# Patient Record
Sex: Male | Born: 1945 | Race: White | Hispanic: No | Marital: Single
Health system: Southern US, Community
[De-identification: ages and names within clinical notes are randomized; demographics above are authoritative.]

---

## 2003-08-19 ENCOUNTER — Other Ambulatory Visit: Payer: Self-pay

## 2004-02-19 ENCOUNTER — Other Ambulatory Visit: Payer: Self-pay

## 2004-02-26 ENCOUNTER — Inpatient Hospital Stay: Payer: Self-pay | Admitting: General Surgery

## 2004-04-14 ENCOUNTER — Ambulatory Visit: Payer: Self-pay | Admitting: Internal Medicine

## 2004-04-23 ENCOUNTER — Ambulatory Visit: Payer: Self-pay | Admitting: Internal Medicine

## 2004-10-13 ENCOUNTER — Ambulatory Visit: Payer: Self-pay | Admitting: Internal Medicine

## 2004-10-22 ENCOUNTER — Ambulatory Visit: Payer: Self-pay | Admitting: Internal Medicine

## 2005-01-28 ENCOUNTER — Emergency Department: Payer: Self-pay | Admitting: General Practice

## 2005-01-28 ENCOUNTER — Other Ambulatory Visit: Payer: Self-pay

## 2005-02-09 ENCOUNTER — Emergency Department: Payer: Self-pay | Admitting: Emergency Medicine

## 2005-02-09 ENCOUNTER — Other Ambulatory Visit: Payer: Self-pay

## 2005-03-27 ENCOUNTER — Emergency Department: Payer: Self-pay | Admitting: Emergency Medicine

## 2005-04-13 ENCOUNTER — Ambulatory Visit: Payer: Self-pay | Admitting: Internal Medicine

## 2005-04-23 ENCOUNTER — Ambulatory Visit: Payer: Self-pay | Admitting: Internal Medicine

## 2013-07-04 ENCOUNTER — Emergency Department: Payer: Self-pay

## 2013-07-04 LAB — CBC
HCT: 33.9 % — AB (ref 40.0–52.0)
HGB: 11.4 g/dL — ABNORMAL LOW (ref 13.0–18.0)
MCH: 30.1 pg (ref 26.0–34.0)
MCHC: 33.8 g/dL (ref 32.0–36.0)
MCV: 89 fL (ref 80–100)
Platelet: 96 10*3/uL — ABNORMAL LOW (ref 150–440)
RBC: 3.81 10*6/uL — AB (ref 4.40–5.90)
RDW: 14 % (ref 11.5–14.5)
WBC: 9.2 10*3/uL (ref 3.8–10.6)

## 2013-07-04 LAB — COMPREHENSIVE METABOLIC PANEL
ALK PHOS: 58 U/L
ALT: 13 U/L (ref 12–78)
ANION GAP: 7 (ref 7–16)
Albumin: 3 g/dL — ABNORMAL LOW (ref 3.4–5.0)
BILIRUBIN TOTAL: 0.3 mg/dL (ref 0.2–1.0)
BUN: 39 mg/dL — ABNORMAL HIGH (ref 7–18)
CREATININE: 1.3 mg/dL (ref 0.60–1.30)
Calcium, Total: 9.6 mg/dL (ref 8.5–10.1)
Chloride: 105 mmol/L (ref 98–107)
Co2: 25 mmol/L (ref 21–32)
EGFR (African American): >60
GFR CALC NON AF AMER: 56 — AB
Glucose: 120 mg/dL — ABNORMAL HIGH (ref 65–99)
OSMOLALITY: 284 (ref 275–301)
Potassium: 4.5 mmol/L (ref 3.5–5.1)
SGOT(AST): 9 U/L — ABNORMAL LOW (ref 15–37)
SODIUM: 137 mmol/L (ref 136–145)
TOTAL PROTEIN: 6.4 g/dL (ref 6.4–8.2)

## 2013-07-16 ENCOUNTER — Inpatient Hospital Stay: Payer: Self-pay | Admitting: Internal Medicine

## 2013-07-16 LAB — CBC
HCT: 30 % — ABNORMAL LOW (ref 40.0–52.0)
HGB: 9.8 g/dL — AB (ref 13.0–18.0)
MCH: 29.3 pg (ref 26.0–34.0)
MCHC: 32.7 g/dL (ref 32.0–36.0)
MCV: 90 fL (ref 80–100)
PLATELETS: 137 10*3/uL — AB (ref 150–440)
RBC: 3.36 10*6/uL — ABNORMAL LOW (ref 4.40–5.90)
RDW: 15.1 % — ABNORMAL HIGH (ref 11.5–14.5)
WBC: 16.8 10*3/uL — ABNORMAL HIGH (ref 3.8–10.6)

## 2013-07-16 LAB — CK TOTAL AND CKMB (NOT AT ARMC)
CK, Total: 28 U/L — ABNORMAL LOW
CK-MB: 0.6 ng/mL (ref 0.5–3.6)

## 2013-07-16 LAB — COMPREHENSIVE METABOLIC PANEL
ALK PHOS: 77 U/L
Albumin: 2.9 g/dL — ABNORMAL LOW (ref 3.4–5.0)
Anion Gap: 8 (ref 7–16)
BILIRUBIN TOTAL: 0.7 mg/dL (ref 0.2–1.0)
BUN: 64 mg/dL — ABNORMAL HIGH (ref 7–18)
CALCIUM: 9.2 mg/dL (ref 8.5–10.1)
Chloride: 104 mmol/L (ref 98–107)
Co2: 26 mmol/L (ref 21–32)
Creatinine: 2.52 mg/dL — ABNORMAL HIGH (ref 0.60–1.30)
EGFR (African American): 29 — ABNORMAL LOW
EGFR (Non-African Amer.): 25 — ABNORMAL LOW
GLUCOSE: 140 mg/dL — AB (ref 65–99)
OSMOLALITY: 296 (ref 275–301)
Potassium: 4.4 mmol/L (ref 3.5–5.1)
SGOT(AST): 33 U/L (ref 15–37)
SGPT (ALT): 30 U/L (ref 12–78)
Sodium: 138 mmol/L (ref 136–145)
Total Protein: 6.3 g/dL — ABNORMAL LOW (ref 6.4–8.2)

## 2013-07-16 LAB — TROPONIN I

## 2013-07-16 LAB — CK-MB: CK-MB: 1 ng/mL (ref 0.5–3.6)

## 2013-07-17 ENCOUNTER — Ambulatory Visit: Payer: Self-pay | Admitting: Internal Medicine

## 2013-07-17 LAB — CBC WITH DIFFERENTIAL/PLATELET
Basophil #: 0 10*3/uL (ref 0.0–0.1)
Basophil %: 0.1 %
Eosinophil #: 0 10*3/uL (ref 0.0–0.7)
Eosinophil %: 0 %
HCT: 25.3 % — ABNORMAL LOW (ref 40.0–52.0)
HGB: 8.6 g/dL — AB (ref 13.0–18.0)
LYMPHS PCT: 8.5 %
Lymphocyte #: 1.2 10*3/uL (ref 1.0–3.6)
MCH: 30.5 pg (ref 26.0–34.0)
MCHC: 34 g/dL (ref 32.0–36.0)
MCV: 90 fL (ref 80–100)
Monocyte #: 1 x10 3/mm (ref 0.2–1.0)
Monocyte %: 7.3 %
Neutrophil #: 12 10*3/uL — ABNORMAL HIGH (ref 1.4–6.5)
Neutrophil %: 84.1 %
PLATELETS: 118 10*3/uL — AB (ref 150–440)
RBC: 2.83 10*6/uL — ABNORMAL LOW (ref 4.40–5.90)
RDW: 15.2 % — ABNORMAL HIGH (ref 11.5–14.5)
WBC: 14.3 10*3/uL — AB (ref 3.8–10.6)

## 2013-07-17 LAB — TROPONIN I: Troponin-I: <0.02 ng/mL

## 2013-07-17 LAB — BASIC METABOLIC PANEL
Anion Gap: 7 (ref 7–16)
BUN: 52 mg/dL — ABNORMAL HIGH (ref 7–18)
CHLORIDE: 107 mmol/L (ref 98–107)
CREATININE: 1.85 mg/dL — AB (ref 0.60–1.30)
Calcium, Total: 8.4 mg/dL — ABNORMAL LOW (ref 8.5–10.1)
Co2: 26 mmol/L (ref 21–32)
EGFR (Non-African Amer.): 37 — ABNORMAL LOW
GFR CALC AF AMER: 43 — AB
Glucose: 128 mg/dL — ABNORMAL HIGH (ref 65–99)
Osmolality: 295 (ref 275–301)
Potassium: 3.9 mmol/L (ref 3.5–5.1)
Sodium: 140 mmol/L (ref 136–145)

## 2013-07-17 LAB — PROTIME-INR
INR: 1.2
Prothrombin Time: 15 secs — ABNORMAL HIGH (ref 11.5–14.7)

## 2013-07-17 LAB — HEMOGLOBIN
HGB: 8.3 g/dL — ABNORMAL LOW (ref 13.0–18.0)
HGB: 8.4 g/dL — AB (ref 13.0–18.0)

## 2013-07-18 LAB — MAGNESIUM
MAGNESIUM: 1.7 mg/dL — AB
Magnesium: 2.7 mg/dL — ABNORMAL HIGH

## 2013-07-18 LAB — HEMOGLOBIN
HGB: 7.8 g/dL — AB (ref 13.0–18.0)
HGB: 7.4 g/dL — ABNORMAL LOW (ref 13.0–18.0)

## 2013-07-18 LAB — PHOSPHORUS
PHOSPHORUS: 2.3 mg/dL — AB (ref 2.5–4.9)
PHOSPHORUS: 2.6 mg/dL (ref 2.5–4.9)

## 2013-07-19 LAB — TRIGLYCERIDES: Triglycerides: 75 mg/dL (ref 0–200)

## 2013-07-19 LAB — HEMOGLOBIN: HGB: 7 g/dL — AB (ref 13.0–18.0)

## 2013-07-20 LAB — BASIC METABOLIC PANEL
Anion Gap: 6 — ABNORMAL LOW (ref 7–16)
BUN: 20 mg/dL — ABNORMAL HIGH (ref 7–18)
CALCIUM: 8.2 mg/dL — AB (ref 8.5–10.1)
CO2: 27 mmol/L (ref 21–32)
CREATININE: 0.89 mg/dL (ref 0.60–1.30)
Chloride: 110 mmol/L — ABNORMAL HIGH (ref 98–107)
Glucose: 78 mg/dL (ref 65–99)
Osmolality: 286 (ref 275–301)
Potassium: 3.7 mmol/L (ref 3.5–5.1)
SODIUM: 143 mmol/L (ref 136–145)

## 2013-07-20 LAB — CBC WITH DIFFERENTIAL/PLATELET
EOS PCT: 4 %
HCT: 21.9 % — ABNORMAL LOW (ref 40.0–52.0)
HGB: 7.3 g/dL — ABNORMAL LOW (ref 13.0–18.0)
Lymphocytes: 10 %
MCH: 30.1 pg (ref 26.0–34.0)
MCHC: 33.4 g/dL (ref 32.0–36.0)
MCV: 90 fL (ref 80–100)
METAMYELOCYTE: 1 %
Monocytes: 6 %
Myelocyte: 1 %
Platelet: 99 10*3/uL — ABNORMAL LOW (ref 150–440)
RBC: 2.43 10*6/uL — ABNORMAL LOW (ref 4.40–5.90)
RDW: 14.9 % — AB (ref 11.5–14.5)
SEGMENTED NEUTROPHILS: 78 %
WBC: 6.6 10*3/uL (ref 3.8–10.6)

## 2013-07-20 LAB — MAGNESIUM: Magnesium: 1.9 mg/dL

## 2013-07-21 LAB — CBC WITH DIFFERENTIAL/PLATELET
BASOS PCT: 0.3 %
Basophil #: 0 10*3/uL (ref 0.0–0.1)
EOS ABS: 0.4 10*3/uL (ref 0.0–0.7)
Eosinophil %: 4.9 %
HCT: 24.9 % — AB (ref 40.0–52.0)
HGB: 8.2 g/dL — ABNORMAL LOW (ref 13.0–18.0)
LYMPHS ABS: 1 10*3/uL (ref 1.0–3.6)
Lymphocyte %: 12 %
MCH: 29.5 pg (ref 26.0–34.0)
MCHC: 33 g/dL (ref 32.0–36.0)
MCV: 89 fL (ref 80–100)
MONO ABS: 0.6 x10 3/mm (ref 0.2–1.0)
Monocyte %: 7.5 %
NEUTROS ABS: 6.3 10*3/uL (ref 1.4–6.5)
Neutrophil %: 75.3 %
Platelet: 118 10*3/uL — ABNORMAL LOW (ref 150–440)
RBC: 2.78 10*6/uL — ABNORMAL LOW (ref 4.40–5.90)
RDW: 15.1 % — ABNORMAL HIGH (ref 11.5–14.5)
WBC: 8.3 10*3/uL (ref 3.8–10.6)

## 2013-07-21 LAB — CULTURE, BLOOD (SINGLE)

## 2013-07-21 LAB — SEDIMENTATION RATE: Erythrocyte Sed Rate: 38 mm/hr — ABNORMAL HIGH (ref 0–20)

## 2013-07-22 LAB — HEMOGLOBIN: HGB: 8.6 g/dL — ABNORMAL LOW (ref 13.0–18.0)

## 2013-07-22 LAB — EXPECTORATED SPUTUM ASSESSMENT W GRAM STAIN, RFLX TO RESP C

## 2013-07-23 ENCOUNTER — Ambulatory Visit: Payer: Self-pay | Admitting: Internal Medicine

## 2014-02-23 ENCOUNTER — Ambulatory Visit: Payer: Self-pay | Admitting: Family Medicine

## 2014-02-23 LAB — VANCOMYCIN, TROUGH: VANCOMYCIN, TROUGH: 15 ug/mL (ref 10–20)

## 2014-02-25 ENCOUNTER — Ambulatory Visit: Payer: Self-pay | Admitting: Family Medicine

## 2014-02-25 LAB — VANCOMYCIN, TROUGH: VANCOMYCIN, TROUGH: 19 ug/mL (ref 10–20)

## 2014-02-27 ENCOUNTER — Ambulatory Visit: Payer: Self-pay | Admitting: Family Medicine

## 2014-02-27 LAB — VANCOMYCIN, TROUGH: VANCOMYCIN, TROUGH: 13 ug/mL (ref 10–20)

## 2014-02-28 ENCOUNTER — Ambulatory Visit: Payer: Self-pay | Admitting: Family Medicine

## 2014-02-28 LAB — BASIC METABOLIC PANEL
ANION GAP: 6 — AB (ref 7–16)
BUN: 12 mg/dL (ref 7–18)
CALCIUM: 8.7 mg/dL (ref 8.5–10.1)
CHLORIDE: 107 mmol/L (ref 98–107)
Co2: 30 mmol/L (ref 21–32)
Creatinine: 0.98 mg/dL (ref 0.60–1.30)
EGFR (Non-African Amer.): >60
GLUCOSE: 77 mg/dL (ref 65–99)
Osmolality: 284 (ref 275–301)
POTASSIUM: 4.3 mmol/L (ref 3.5–5.1)
Sodium: 143 mmol/L (ref 136–145)

## 2014-02-28 LAB — CBC WITH DIFFERENTIAL/PLATELET
BASOS ABS: 0 10*3/uL (ref 0.0–0.1)
Basophil %: 0.5 %
EOS PCT: 4.2 %
Eosinophil #: 0.2 10*3/uL (ref 0.0–0.7)
HCT: 33.2 % — ABNORMAL LOW (ref 40.0–52.0)
HGB: 10.4 g/dL — ABNORMAL LOW (ref 13.0–18.0)
LYMPHS ABS: 1.1 10*3/uL (ref 1.0–3.6)
Lymphocyte %: 21.5 %
MCH: 22.8 pg — ABNORMAL LOW (ref 26.0–34.0)
MCHC: 31.2 g/dL — AB (ref 32.0–36.0)
MCV: 73 fL — ABNORMAL LOW (ref 80–100)
MONOS PCT: 7.8 %
Monocyte #: 0.4 x10 3/mm (ref 0.2–1.0)
NEUTROS ABS: 3.3 10*3/uL (ref 1.4–6.5)
Neutrophil %: 66 %
PLATELETS: 91 10*3/uL — AB (ref 150–440)
RBC: 4.54 10*6/uL (ref 4.40–5.90)
RDW: 18.8 % — AB (ref 11.5–14.5)
WBC: 5 10*3/uL (ref 3.8–10.6)

## 2014-02-28 LAB — VANCOMYCIN, TROUGH: Vancomycin, Trough: 23 ug/mL (ref 10–20)

## 2014-03-02 ENCOUNTER — Ambulatory Visit: Payer: Self-pay | Admitting: Family Medicine

## 2014-03-02 LAB — VANCOMYCIN, TROUGH: Vancomycin, Trough: 10 ug/mL (ref 10–20)

## 2014-03-03 ENCOUNTER — Ambulatory Visit: Payer: Self-pay | Admitting: Family Medicine

## 2014-03-03 LAB — VANCOMYCIN, TROUGH: Vancomycin, Trough: 16 ug/mL (ref 10–20)

## 2014-04-23 DEATH — deceased

## 2014-09-14 NOTE — H&P (Signed)
PATIENT NAME:  Brent Weaver, Brent Weaver DATE OF BIRTH:  Jun 15, 1945  DATE OF ADMISSION:  07/16/2013  PR[facility]IC[facility], Dr. Drue Flirtarol Henry-Smith.   CHIEF COMPLAINT: The patient was sent in for vomiting blood, arrived on BiPAP. ER physician intubated, placed a central line. In the ER, he was found to have coffee-ground emesis, whiteout of the left lung, elevated white count, hypotension, on Levophed, acute renal failure. Hospitalist services were contacted for further evaluation. The patient is unable to give any history since he is on the ventilator.   PAST MEDICAL HISTORY: Depression, hypertension, hyperlipidemia, psych history, chronic pain, COPD, glaucoma.   PAST SURGICAL HISTORY: Looking back at prior records here, he had exploration of right fem-pop vein with attempted thrombectomy. It looks like he had angiograms in the past. The patient had a right AKA. Other than that, unable to obtain.   ALLERGIES: In the computer: No known drug allergies.   MEDICATIONS: As per medication list include albuterol CFC-free 90 mcg per inhalation 1 puff 4 times a day as needed for wheezing, aspirin 325 mg daily, Celexa 10 mg daily, Eucerin topical cream, Lac-Hydrin 12% to lower extremities as needed dry skin, Lasix 20 mg daily, lisinopril 2.5 mg daily, lovastatin 40 mg at bedtime, metoprolol 100 mg extended-release daily, Neurontin 100 mg twice a day, nitroglycerin 0.4 mg 1 tablet every 5 minutes as needed for chest pain, olanzapine 10 mg at bedtime, oxycodone 2 tablets 5 mg as needed after wound care, oxycodone 5 mg every 6 hours as needed for pain, senna 8.6 two tablets twice a day for constipation, Spiriva 1 inhalation daily, Travatan Z 0.04% ophthalmic solution to right eye daily, Tylenol Extra-Strength 500 mg 2 tablets 3 times a day.  SOCIAL HISTORY: [facility]. Unable to tell anything else.   FAMILY HISTORY: Unable to obtain secondary to altered mental  status.  REVIEW OF SYSTEMS: Unable to obtain secondary to altered mental status.   PHYSICAL EXAMINATION:   VITAL SIGNS: On presentation included a temperature of 98.4, pulse 90, respirations 28, blood pressure 67/44, pulse oximetry 90% on CPAP.  GENERAL: After intubation, no respiratory distress.  EYES: Pupil on the right 3 mm, pupil on the left pinpoint. Unable to test extraocular muscles.  EARS, NOSE, MOUTH, AND THROAT: Tympanic membranes: No erythema. Nasal mucosa: Slight erythema. Throat: Unable to examine.  NECK: No JVD. No bruits. No lymphadenopathy. No thyromegaly. No thyroid nodules palpated.  RESPIRATORY: Decreased breath sounds left lung, rhonchi throughout the left lung.  CARDIOVASCULAR: S1, S2 normal. No gallops, rubs, murmurs. Carotid upstroke 2+ bilaterally. Dorsalis pedis pulse on the left 1/2+. Trace edema on the left lower extremity.  ABDOMEN: Soft. Pulsatile mass felt. No organomegaly/splenomegaly. Normoactive bowel sounds.  LYMPHATIC: No lymph nodes in the neck.  MUSCULOSKELETAL: Trace edema of the lower extremity. No cyanosis on ventilator.  PSYCHIATRIC: Unable to test.  NEUROLOGIC: Unable to test secondary to altered mental status.  SKIN: Right lower extremity open wound, slight erythema, foul-smelling discharge.   EKG: Atrial fibrillation with PVCs, left axis deviation, left bundle branch block.   ASSESSMENT AND PLAN: 1.  Septic shock with multiorgan failure: Will give IV fluid hydration. The patient already received IV fluid boluses. The patient is on Levophed to maintain blood pressure. Will give empiric antibiotics of Zosyn and Levaquin for likely aspiration pneumonia.  2.  Acute respiratory failure: Current ABG satisfactory with a pH of 7.35, pCO2 of 41, pO2 of 235 on  100%, oxygen saturation 100.4; that was on the ventilator. He was brought down to 60% FiO2. Will repeat a blood gas in the morning. Get a pulmonary consultation. Likely may end up needing a bronchoscopy  for the aspiration pneumonia.  3.  Acute renal failure: Will give IV fluid hydration. Check a creatinine in the a.m.  4.  Acute encephalopathy, likely with septic shock: Continue to monitor. Since pupils are irregular, we will get a CT scan of the head.  5.  Large aneurysm on CT scan measuring 9 cm x 7.5 cm. The patient is not a surgical candidate at this point. We will get a vascular surgery consultation in the a.m. We will also have vascular look at the open wound on the above-knee-amputation on the right lower extremity.  6.  Anemia with vomiting coffee-ground emesis: Will start on IV Protonix. Get serial hemoglobins. IV fluid hydration.  7.  Leukocytosis secondary to septic shock.  8.  They did do brief cardiopulmonary resuscitation in the ER for a weak pulse. No medications were given, patient intubated.   TIME SPENT ON ADMISSION: 55 minutes critical care time.   The patient is a ward of the state, which ER physician contacted to contemplate a DO NOT RESUSCITATE. No decision made. At this point, the patient is a FULL CODE. We will get care manager to look into things and a palliative care consultation in the a.m.    ____________________________ Herschell Dimes. Renae Gloss, MD rjw:jcm D: 07/16/2013 19:12:53 ET T: 07/16/2013 20:08:25 ET JOB#: [id number]  cc: Herschell Dimes. Renae Gloss, MD, <Dictator> Marry Guan. Henry-Smith, MD Salley Scarlet MD ELECTRONICALLY SIGNED 07/24/2013 10:10

## 2014-09-14 NOTE — Consult Note (Signed)
   Comments   I received a call from Melynda [facility]an [facility]. She confirms that pt has no family so code status is determined by DSS. Ms Earl GalaOsborne has spoken with other DSS workers involved familiar with pt and they are all in agreement with DNR. Order entered.  Earl GalaOsborne realizes that pt is critically ill and that DSS will likely be asked to make further decisions regarding pt's care.   Electronic Signatures: Cleven Jansma, Harriett SineNancy (MD)  (Signed 24-Feb-15 10:09)  Authored: Palliative Care   Last Updated: 24-Feb-15 10:09 by Quention Mcneill, Harriett SineNancy (MD)

## 2014-09-14 NOTE — Discharge Summary (Signed)
PATIENT NAME:  Brent Weaver, Brent Weaver MR#:  [id number] DATE OF BIRTH:  [birth year]  DATE OF ADMISSION:  07/16/2013 DATE OF DISCHARGE:  07/23/2013  CONSULTANTS: Dr. Gilda Crease from vascular surgery, Dr. Shelle Iron from GI, Dr. Harvie Junior from palliative care, Dr. Gwen Pounds from cardiology, Dr. Belia Heman from pulmonary/critical care and speech therapy.   CHIEF COMPLAINT: Brought in from [facility] for possible vomiting of blood.   DISCHARGE DIAGNOSES: 1.  Septic shock with possible aspiration pneumonia and right stump infection, resolved.  2.  Aspiration pneumonia.  3.  Acute respiratory failure secondary to aspiration into airway.  4.  Hematemesis/possible coffee-ground emesis or possibly upper gastrointestinal bleed, resolved.  5.  Acute renal failure, resolved.  6.  Large aortic abdominal aneurysm.  7.  Atrial fibrillation with rapid ventricular response.  8.  Hypomagnesemia.  9.  Hypophosphatemia.  10.  Constipation/impaction.  11.  History of chronic obstructive pulmonary disease.  12.  History of hyperlipidemia, hypertension, depression and glaucoma.  13.  History of psychiatric issues.   DISCHARGE MEDICATIONS: Tylenol Extra-Strength 500 mg 2 tabs orally 3 times a day, lovastatin 40 mg once a day with supper, Travatan Z 0.004% ophthalmic solution 1 drop to each eye once a day, Spiriva 18 mcg inhaled daily, olanzapine 10 mg 1 tab once a day at bedtime for bipolar disorder, nitroglycerin p.r.n. 0.4 mg sublingually every 5 minutes for chest pain, Lasix 20 mg once a day, citalopram 10 mg once a day at bedtime for depression, albuterol 1 puff 4 times a day p.r.n. for wheezing, senna 8.6 mg 2 tabs 2 times a day, Eucerin topical cream p.r.n. for dry skin, lisinopril 2.5 mg daily, aspirin 325 mg daily, Lac-Hydrin 12% p.r.n. applied to the lower extremities as needed, Neurontin 100 mg 2 times a day, oxycodone 5 mg every 6 hours as needed for pain, diltiazem extended-release 240 mg once a day, Levaquin 750 mg daily  for 2 days, carvedilol 3.125 mg 2 times a day.   DIET: Low sodium, low fat, low cholesterol.  Pureed consistency.  ACTIVITY: As tolerated.   FOLLOWUP: Please follow with PCP within 1 to 2 weeks. Please follow with GI within 1 to 2 weeks   LABORATORY, DIAGNOSTIC AND RADIOLOGICAL DATA:  Initial creatinine 2.52, sodium 138, potassium 4.4, last creatinine of 0.89. Initial white count of 16.8, last white count of 8.3, initial hemoglobin of 9.8, lowest hemoglobin was 7, last hemoglobin of 8.6. Blood cultures on admission no growth to date. Sputum cultures on February 25 showing light Citrobacter specimen. 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   HISTORY OF PRESENT ILLNESS AND HOSPITAL COURSE: For full details of H and P, please see the dictation on February 23 by Dr. Renae Gloss but, briefly, this is a 69 year old male with the above medical conditions, who came in from [facility] after he was seen possibly, vomiting blood, arrived on BiPAP, here got intubated and a central line was placed. The patient required Levophed, was admitted to the hospitalist service and was also noted with renal failure. He was started on broad-spectrum antibiotics, on the vent. The patient did have  evidence for septic shock on admission, requiring Levophed. The patient was seen by pulmonary and underwent a bronchoscopy per Dr. Belia HemanKasa. Thick purulent secretions from the lower lobes of lungs were found and the cultures ultimately did grow Citrobacter. At this point, the patient has been  extubated for  several days. The shock has resolved and he will be discharged with a couple more days of Levaquin and the antibiotics have been tapered down as the Citrobacter is sensitive. Blood cultures have been negative and blood pressures are stable.   In regards to acute respiratory failure that was secondary to aspiration into airway and aspiration pneumonia, he has been off of oxygen and the respiratory failure has resolved.   In regards to hematemesis/possible coffee-ground emesis, this was possibly upper GI bleed. The patient was seen by GI. Although hemoglobin did trend down, currently it is on an up-trend without any need for a blood transfusion and he can follow as an outpatient with GI for possible upper endoscopy. He should take iron as an outpatient.   In regards to the large abdominal aortic aneurysm, the DSS is aware of this and apparently they have been told by [facility] her last year that there is nothing to be done for this surgically and here he was seen by Dr. Gilda CreaseSchnier and also discussed the case with Dr. Wyn Quakerew, who recommends no procedures at this time.   The patient did have bouts of A. fib with RVR and at this point he is on Cardizem.  The rate has been increased. He also will be discharged on low-dose Coreg.   Overall, he does have a poor prognosis with multiple comorbidities. [facility].   TOTAL TIME SPENT: Is 35 minutes. The case was discussed with the patient's decision-maker at department of social services today.   ____________________________ Krystal EatonShayiq Tacoya Altizer, MD sa:cs D: 07/23/2013 14:51:18 ET T: 07/23/2013 15:16:25 ET JOB#: [id number]  cc: Krystal EatonShayiq Jessee Mezera, MD, <Dictator> Krystal EatonSHAYIQ Johonna Binette MD ELECTRONICALLY SIGNED 07/27/2013 13:06

## 2014-09-14 NOTE — Consult Note (Signed)
Present Illness The patient was sent in for vomiting blood, arrived on BiPAP. ER physician intubated, placed a central line. The patient was found to have whiteout of the left lung, elevated white count, hypotension, on Levophed, acute renal failure. He was incidentally found to have a large AAA.  He is in the ICU.  He has a past history of multiple lower extremity vascular procedures and currently has a right AKA.  The patient is unable to give any history since he is on the ventilator.   PAST MEDICAL HISTORY: 1.  Coronary artery disease.  2.  CHF EF of 15%.  3.  COPD. 4.  Depression.  5.  Hypertension.  6.  Glaucoma.   PAST SURGICAL HISTORY:  1.  Right fem pop bypass occluded 2.  Right AKA   Home Medications: Medication Instructions Status  lovastatin 40 mg oral tablet 1 tab(s) orally once a day at supper time Active  Travatan Z 0.004% ophthalmic solution 1 drop(s) to each eye once a day Active  Spiriva 18 mcg inhalation capsule 1 cap(s) inhaled once a day Active  OLANZapine 10 mg oral tablet 1 tab(s) orally once a day (at bedtime) for bipolar disorder Active  nitroglycerin 0.4 mg sublingual tablet 1 tab(s) sublingual every 5 minutes, As Needed - for Chest Pain Active  Lasix 20 mg oral tablet 1 tab(s) orally once a day for edema Active  citalopram 10 mg oral tablet 1 tab(s) orally once a day (at bedtime) for depression Active  albuterol CFC free 90 mcg/inh inhalation aerosol 1 puff(s) inhaled 4 times a day, As Needed - for Wheezing Active  metoprolol succinate 100 mg oral tablet, extended release 1 tab(s) orally once a day for hypertension, angina, and CHF Active  Senna 8.6 mg oral tablet 2 tab(s) (17.2 mg) orally 2 times a day for constipation Active  Eucerin - topical lotion Apply topically to skin after bath prn, As Needed for dry skin Active  lisinopril 2.5 mg oral tablet 1 tab(s) orally once a day for hypertension Active  Aspirin Enteric Coated 325 mg oral delayed release tablet  1 tab(s) orally once a day for stroke prevention Active  Lac-Hydrin 12% topical cream Apply topically to lower extremities prn, As Needed for dry skin Active  Neurontin 100 mg oral capsule 1 cap(s) orally 2 times a day Active  Tylenol Extra Strength 500 mg oral tablet 2 tab(s) (1000 mg) orally 3 times a day Active  oxyCODONE 5 mg oral tablet 2 tab(s) orally prn, As Needed 30 minutes prior to wound care Active  oxyCODONE 5 mg oral tablet 1 tab(s) orally every 6 hours, As Needed - for Pain Active    No Known Allergies:   Case History:  Family History Non-Contributory   Social History negative tobacco, negative ETOH, negative Illicit drugs   Review of Systems:  ROS Pt not able to provide ROS   Physical Exam:  GEN well developed, critically ill appearing   HEENT dry oral mucosa, poor dentition, ET tube present   NECK trachea midline  no deformity   RESP ventilated and sedated   CARD regular rate  no JVD   ABD soft  nondistended, pulsitile mass present   EXTR right AKA with open wound medial incisional scar noninfected, left foot pulses nonpalpable   SKIN positive ulcers, skin turgor poor   PSYCH sedated, intubated   Nursing/Ancillary Notes: **Vital Signs.:   24-Feb-15 12:00  Vital Signs Type Routine  Temperature Temperature (F) 99.2  Celsius  37.3  Temperature Source oral  Pulse Pulse 92  Respirations Respirations 16  Systolic BP Systolic BP 161  Diastolic BP (mmHg) Diastolic BP (mmHg) 67  Mean BP 81  Pulse Ox % Pulse Ox % 98  Pulse Ox Activity Level  At rest  Oxygen Delivery Ventilator Assisted  Pulse Ox Heart Rate 90   Routine Chem:  24-Feb-15 04:58   Result Comment LABS - This specimen was collected through an   - indwelling catheter or arterial line.  - A minimum of 35ms of blood was wasted prior    - to collecting the sample.  Interpret  - results with caution.  Result(s) reported on 17 Jul 2013 at 05:40AM.  Glucose, Serum  128  BUN  52  Creatinine  (comp)  1.85  Sodium, Serum 140  Potassium, Serum 3.9  Chloride, Serum 107  CO2, Serum 26  Calcium (Total), Serum  8.4  Anion Gap 7  Osmolality (calc) 295  eGFR (African American)  43  eGFR (Non-African American)  37 (eGFR values <624mmin/1.73 m2 may be an indication of chronic kidney disease (CKD). Calculated eGFR is useful in patients with stable renal function. The eGFR calculation will not be reliable in acutely ill patients when serum creatinine is changing rapidly. It is not useful in  patients on dialysis. The eGFR calculation may not be applicable to patients at the low and high extremes of body sizes, pregnant women, and vegetarians.)  Cardiac:  24-Feb-15 01:28   Troponin I < 0.02 (0.00-0.05 0.05 ng/mL or less: NEGATIVE  Repeat testing in 3-6 hrs  if clinically indicated. >0.05 ng/mL: POTENTIAL  MYOCARDIAL INJURY. Repeat  testing in 3-6 hrs if  clinically indicated. NOTE: An increase or decrease  of 30% or more on serial  testing suggests a  clinically important change)  Routine Coag:  24-Feb-15 16:16   Prothrombin  15.0  INR 1.2 (INR reference interval applies to patients on anticoagulant therapy. A single INR therapeutic range for coumarins is not optimal for all indications; however, the suggested range for most indications is 2.0 - 3.0. Exceptions to the INR Reference Range may include: Prosthetic heart valves, acute myocardial infarction, prevention of myocardial infarction, and combinations of aspirin and anticoagulant. The need for a higher or lower target INR must be assessed individually. Reference: The Pharmacology and Management of the Vitamin K  antagonists: the seventh ACCP Conference on Antithrombotic and Thrombolytic Therapy. [id number]:126 (3suppl): [id number] HCT value >55% may artifactually increase the PT.  In one study,  the increase was an average of 25%. Reference:  "Effect on Routine and Special Coagulation Testing Values of  Citrate Anticoagulant Adjustment in Patients with High HCT Values." American Journal of Clinical Pathology 2006;126:400-405.)  Routine Hem:  24-Feb-15 01:28   Hemoglobin (CBC)  8.3 (Result(s) reported on 17 Jul 2013 at 01:48AM.)    04:58   Hemoglobin (CBC)  8.6  WBC (CBC)  14.3  RBC (CBC)  2.83  Hematocrit (CBC)  25.3  Platelet Count (CBC)  118  MCV 90  MCH 30.5  MCHC 34.0  RDW  15.2  Neutrophil % 84.1  Lymphocyte % 8.5  Monocyte % 7.3  Eosinophil % 0.0  Basophil % 0.1  Neutrophil #  12.0  Lymphocyte # 1.2  Monocyte # 1.0  Eosinophil # 0.0  Basophil # 0.0    16:16   Hemoglobin (CBC)  8.4 (Result(s) reported on 17 Jul 2013 at 04:47PM.)   CT:    23-Feb-15 18:02, CT Chest  Abdomen and Pelvis WO  CT Chest Abdomen and Pelvis WO   REASON FOR EXAM:    (1) hematemesis, respiratory failure, distended abd,   firm on Left, with decrease  COMMENTS:       PROCEDURE: CT  - CT CHEST ABDOMEN AND PELVIS WO  - Jul 16 2013  6:02PM     CLINICAL DATA:  69 year old male with chest, abdomen and pelvic  pain. History of abdominal aortic aneurysm.    EXAM:  CT CHEST, ABDOMEN AND PELVIS WITHOUT CONTRAST    TECHNIQUE:  Multidetector CT imaging of the chest, abdomen and pelvis was  performed following the standard protocol without IV contrast.  COMPARISON:  08/29/2003 abdomen/pelvic CT report. Images are  available for comparison.    FINDINGS:  CT CHEST FINDINGS    Atelectasis/ collapse of the majority of left lung identified.  Mucous/soft tissue fills the left mainstem bronchus.    An endotracheal tube is identified with tip 5 mm above the carina.    Cardiomegaly and heavy coronary artery calcifications are  identified. An ascending thoracic aortic aneurysm is identified  measuring 5.5 cm in greatest diameter.  No pleural or pericardial effusion identified.    No enlarged or abnormal appearing axillary lymph nodes are  identified.    Scattered and patchy ground-glass opacities  throughout right lung  identified-question aspiration or pneumonia no discrete pulmonary is  identified.    CT ABDOMEN AND PELVIS FINDINGS    Abdominal aortic, common iliac and common femoral artery aneurysms  are identified.    The abdominal aortic aneurysm measures 7.5 x 9 cm in maximal  diameter. Per prior report from 2005, the abdominal aortic aneurysm  measured 3.5 cm in maximum diameter.    The right common iliac artery measures 6 cm in greatest diameter.    The left common iliac artery measures 4.7 cm in greatest diameter.    The right common femoral artery measures 2.8 cm in greatest  diameter.    The left common femoral artery measures 4.1 cm in greatest diameter.    There is no evidence of free fluid or perianeurysmal  collection/hematoma.  The liver, spleen, right kidney adrenal glands and pancreas are  unremarkable.    Gallbladder sludge versus stones identified without CT evidence of  acute cholecystitis.    10 x 17 mm calculus within the left renal pelvis is identified  without hydronephrosis. At least 3 small nonobstructing calculi are  present within the mid-lower left kidney.    A probable cyst within the left lower pole is present.    Please note that parenchymal abnormalities may be missed without  intravenous contrast.  There is no evidence of enlarged lymph nodes, fluid or biliary  dilatation.    A very large of stool within the rectum is identified with mild  rectal wall thickening/edema. There is no evidence of bowel  obstruction, pneumoperitoneum or abscess.    A Foley catheter within the bladder is noted.    No acute or suspicious bony abnormalities identified.    IMPRESSION:  7.5 x 9 cm abdominal aortic aneurysm without evidence of rupture -  increased in size from 3.5 cm in 2005. Vascular surgery consultation  recommended due to increased risk of rupture for AAA >5.5 cm. This  recommendation follows ACR consensus. This recommendation  follows  ACR consensus guidelines: White Paper of the ACR Incidental Findings  Committee II on Vascular Findings. J Am Coll Radiol 2013;  10:789-794.    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    Atelectasis/collapse of the majority of the left long. Mucous/soft  tissue fills the left mainstem bronchus.    Patchy ground-glass opacities throughout the right lung - likely  pneumonia or aspiration.    10 x 17 mm calculus within left renal pelvis - no evidence of  hydronephrosis. Other small nonobstructing left renal calculi.    Gallbladder sludge/stones - no CT evidence of acute cholecystitis.    Very large amount of rectal stool/impaction without obstruction.  Mild rectal wall thickening/edema.    Cardiomegaly and coronary artery disease.    Results were called by telephone at the time of interpretation on  07/16/2013 at 6:29 PM to Dr. Francene Castle , who verbally  acknowledged these results.      Electronically Signed    By: Hassan Rowan M.D.    On: 07/16/2013 18:31         Verified By: Lura Em, M.D.,    Impression 1.  AAA        patient is critically ill at this tiem and not a candidate for repair       If he were to recover then reevaluation with CT angio could be done 2.  Right AKA wound       does not appear to have periulcer cellulitis        continue dressing changes as ordered 3.  Septic shock with multiorgan failure: Will give IV fluid hydration. The patient already received IV fluid boluses. The patient is on Levophed to maintain blood pressure. Will give empiric antibiotics of Zosyn and Levaquin for likely aspiration pneumonia.  4.  Acute respiratory failure: Current ABG satisfactory with a pH of 7.35, pCO2 of 41, pO2 of 235 on 100%, oxygen saturation 100.4; that was on the ventilator. He was brought down to 60% FiO2. Will repeat a blood gas in the morning. Get a pulmonary consultation. Likely may end up needing  a bronchoscopy for the aspiration pneumonia.  5.  Acute renal failure: Will give IV fluid hydration. Check a creatinine in the a.m.  6.  Acute encephalopathy, likely with septic shock: Continue to monitor. Since pupils are irregular, we will get a CT scan of the head.   Plan level 4 consult   Electronic Signatures: Hortencia Pilar (MD)  (Signed 25-Feb-15 11:32)  Authored: General Aspect/Present Illness, Home Medications, Allergies, History and Physical Exam, Vital Signs, Labs, Radiology, Impression/Plan   Last Updated: 25-Feb-15 11:32 by Hortencia Pilar (MD)

## 2014-09-14 NOTE — Consult Note (Signed)
PATIENT NAME:  Brent Weaver, Brent Weaver MR#:  [id number] DATE OF BIRTH:  1945-05-26  DATE OF CONSULTATION:  07/17/2013  REFERRING PHYSICIAN:  Dr. Alford Highland CONSULTING PHYSICIAN:  Dow Adolph, MD  GASTROENTEROLOGY INPATIENT CONSULTATION  REASON FOR THE CONSULT: Coffee-ground emesis.   HISTORY OF PRESENT ILLNESS: Mr. Brent Weaver is a 69 year old male with a complicated past medical history including coronary disease, ischemic cardiomyopathy EF of 15%, peripheral vascular disease, bipolar disorder, COPD who was brought to the Emergency Room from his skilled nursing facility for hematemesis, hypotension, altered mental status. In the Emergency Room, he was found to have a complete whiteout in his left lung field, and he was also found to have a condition consistent with septic shock. He was also noted to have a large AAA of 9 x 7.5 cm along with acute renal failure.   Currently, GI is consulted for evaluation of coffee-ground emesis. Mr. Brent Weaver is unable to give any history at this time due to being intubated and sedated. I have also discussed his current clinical status with the nurse as far as his clinical course while in the hospital.   I understand that while here in the hospital Mr. Brent Weaver did have some coffee-ground emesis. Overall, his hemoglobin did drop slightly but is currently stable at this time. I am told that he has not had any bowel movements while he has been in the hospital here.   Currently he has a bilious material coming out of his OG tube. There is no blood or coffee-grounds currently coming out of his OG tube.   PAST MEDICAL HISTORY: 1.  Coronary artery disease.  2.  CHF EF of 15%.  3.  COPD. 4.  Depression.  5.  Hypertension.  6.  Glaucoma.   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   ALLERGIES: No known drug allergies.  MEDICATIONS:  At his facility were: 1.  Albuterol 1 puff 4 times a day.  2.  Aspirin 325 daily.  3.  Celexa 10 daily.  4.   Lasix 20 daily.  5.  Lisinopril 2.5 mg daily.  6.  Lovastatin 40 mg at bedtime.  7.  Metoprolol 100 mg daily.  8.  Neurontin 100 mg twice a day.  9.  Olanzapine 10 mg at bedtime.  10.  Oxycodone 2 tablets p.r.n.  11.  Spiriva 1 inhalation daily. 12.  Tylenol extra-strength 500 mg 2 times a day.   SOCIAL HISTORY: He does live at [facility]. He is currently intubated and sedated, so we do not have any more social history.  FAMILY HISTORY: no family hx of GI malignancy.   REVIEW OF SYSTEMS:  Again, intubated and sedated.   PHYSICAL EXAMINATION: VITAL SIGNS:  His current temperature is 99.2. His pulse is 118. Respirations are 16. Blood pressure is 104/62. He is 95% on ventilator. GENERAL: Intubated and sedated. He appears critically ill. HEENT:  Eyes:  Pupils are responsive to light.  ET tube is in place. No exudate appreciated.  NECK: Normal JVP. Soft, nontender. RESPIRATORY:  Course breath sounds consistent with ventilator. CARDIOVASCULAR:  Tachycardic. No murmurs, rubs or gallops.  ABDOMEN: Soft, normoactive bowel sounds, nontender.  MUSCULOSKELETAL: There is no swelling of his joints that is observed. NEUROLOGIC: Unable to test due to being intubated and sedated. SKIN: No rashes or lesions. There is an open wound on his right lower extremity.   LABORATORY DATA:  Currently, white count is 14.3. Hemoglobin is 8.6, which is up from  8.3. Hematocrit is 25. Platelets are 118. His BUN is 52, creatinine 1.85. Sodium is 140, potassium 3.9. Chloride is 107. Bicarb is 26.   ASSESSMENT AND PLAN:  Coffee-ground emesis: It appears that Mr. Brent Weaver is critically ill at this time. He does have multiple severe comorbidities. Based on the whiteout in the lung and other laboratory abnormalities, I do not think that the coffee-ground emesis is his major problem at this time. He also has a stable hemoglobin and has some bilious material coming out of orogastric tube. I suspect that the upper  bleeding has now resolved.   PLAN: Given his severe comorbidities, I believe that the risks of an upper endoscopy at this time would outweigh the benefits. I would recommend continuing to monitor his hemodynamics and hemoglobin closely. He can be continued on a q.12 IV PPI. Certainly, if he were to have a change in his clinical status and it appears that he were actively bleeding with a drop in his hemoglobin, the benefit of an upper endoscopy may outweigh the risks at that time. However, for now, I would recommend continuing to watch conservatively. I will continue to follow.  Thank you for this consult.   ____________________________ Dow AdolphMatthew Rein, MD mr:ce D: 07/17/2013 16:03:28 ET T: 07/17/2013 16:14:23 ET JOB#: [id number]  cc: Dow AdolphMatthew Rein, MD, <Dictator> Kathalene FramesMATTHEW G REIN MD ELECTRONICALLY SIGNED 07/24/2013 13:51

## 2016-02-10 IMAGING — CT CT CHEST-ABD-PELV W/O CM
2 of 4 series · 13 of 46 positions shown, 15 images · non-contrast
Comparison: 08/29/2003 abdomen/pelvic CT report. Images are
available for comparison.

CLINICAL DATA: 67-year-old male with chest, abdomen and pelvic
pain. History of abdominal aortic aneurysm.

EXAM:
CT CHEST, ABDOMEN AND PELVIS WITHOUT CONTRAST
TECHNIQUE: Multidetector CT imaging of the chest, abdomen and pelvis was
performed following the standard protocol without IV contrast.

[Series 2: cap without · axial · non-contrast · 0.88mm/px · z∈[-308,+252]mm · 10 of 138 slices shown, 12 images]
[im 13/138  soft-tissue]
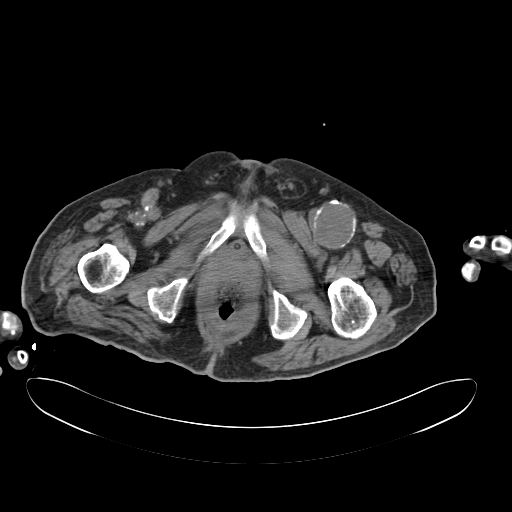
[im 13/138  bone]
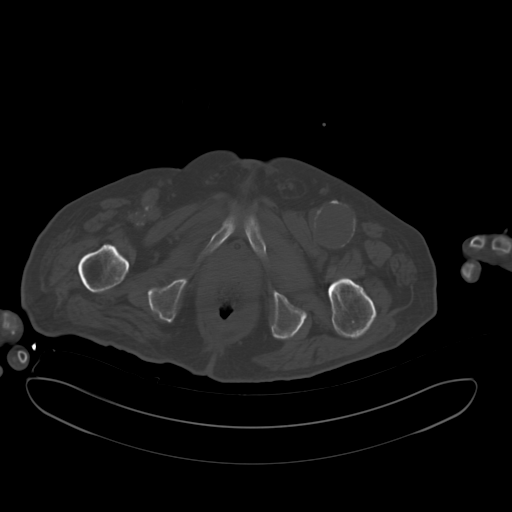
[im 25/138  soft-tissue]
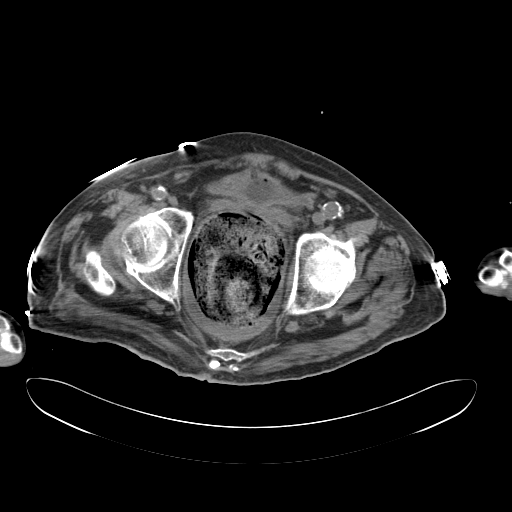
[im 38/138  soft-tissue]
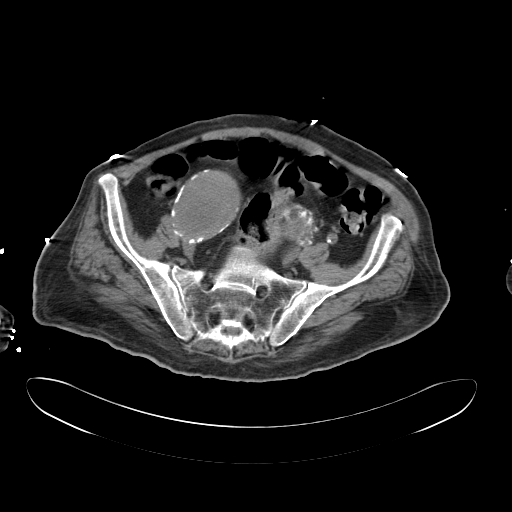
[im 50/138  soft-tissue]
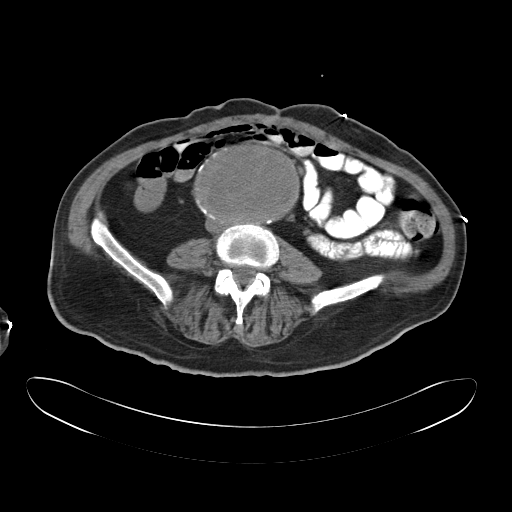
[im 63/138  soft-tissue]
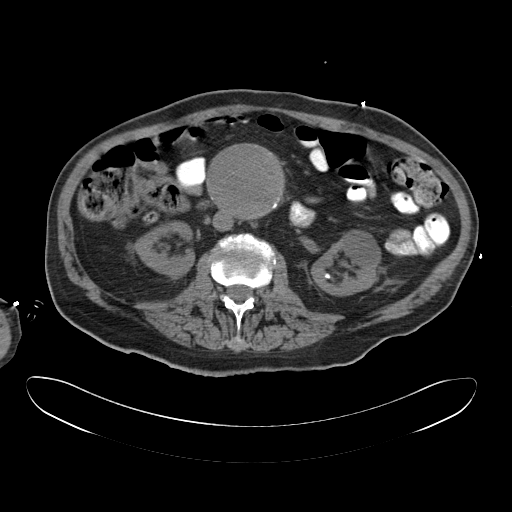
[im 75/138  soft-tissue]
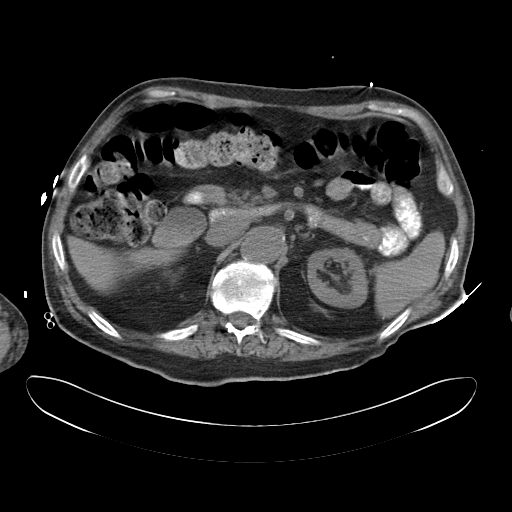
[im 88/138  soft-tissue]
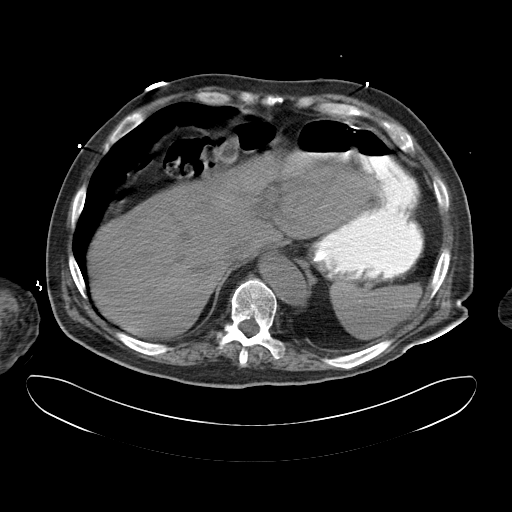
[im 100/138  soft-tissue]
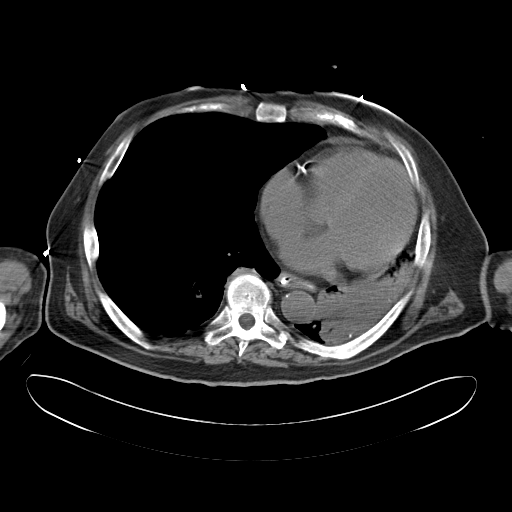
[im 113/138  soft-tissue]
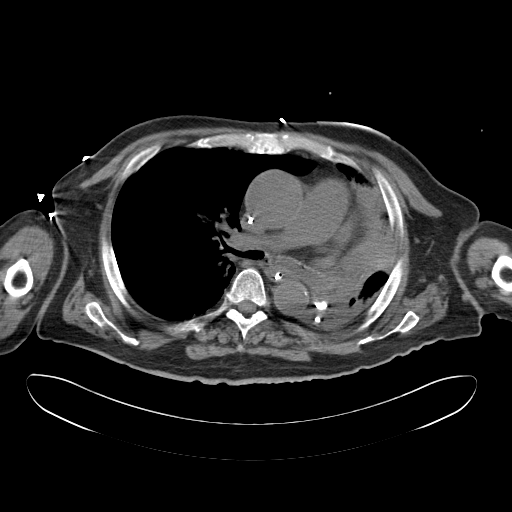
[im 113/138  bone]
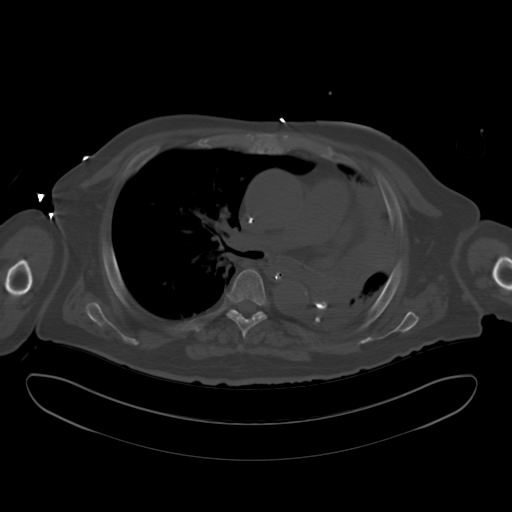
[im 125/138  soft-tissue]
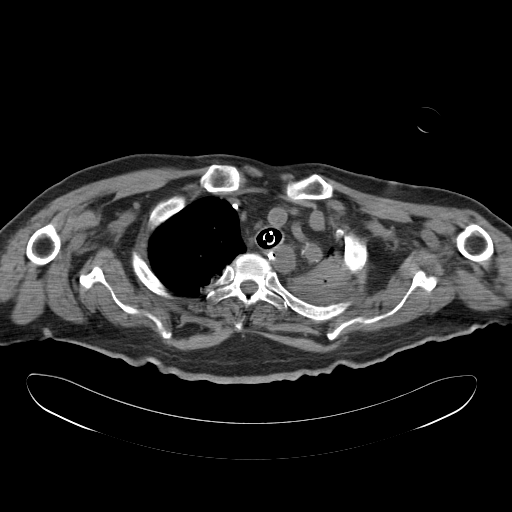

[Series 5: cor cap wo · coronal · 0.81mm/px · 3 of 129 slices shown]
[im 43/129  soft-tissue]
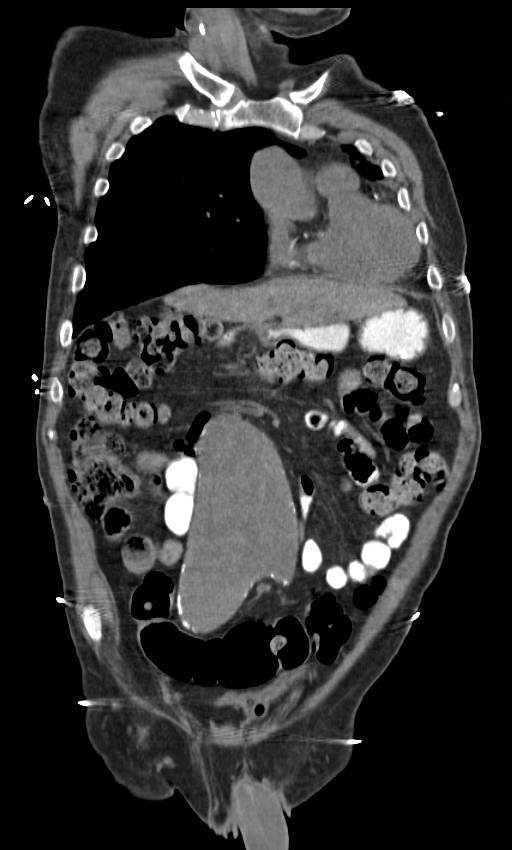
[im 57/129  soft-tissue]
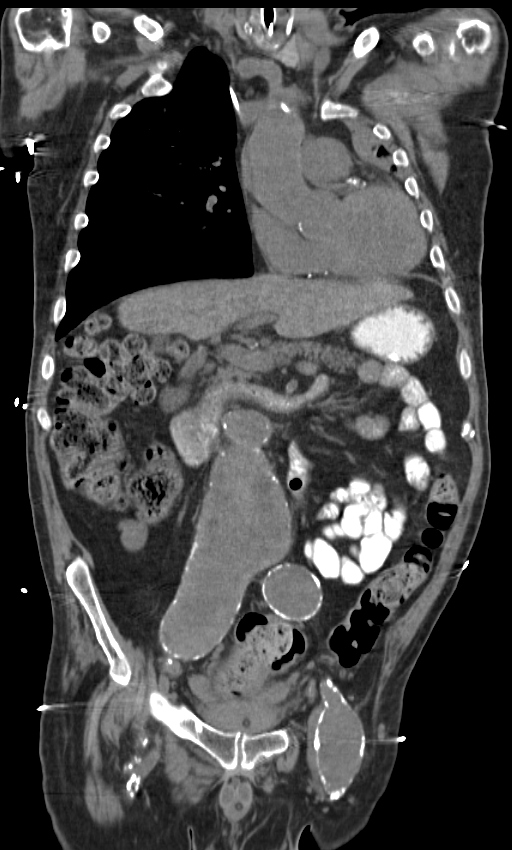
[im 72/129  soft-tissue]
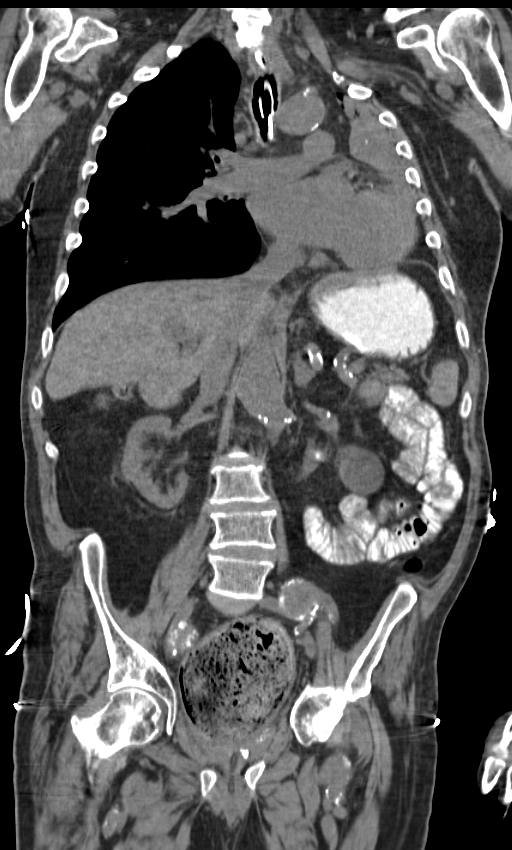

[13 of 46 positions shown; findings below may reference images not displayed]

FINDINGS: CT CHEST FINDINGS

Atelectasis/ collapse of the majority of left lung identified.
Mucous/soft tissue fills the left mainstem bronchus.

An endotracheal tube is identified with tip 5 mm above the carina.

Cardiomegaly and heavy coronary artery calcifications are
identified. An ascending thoracic aortic aneurysm is identified
measuring 5.5 cm in greatest diameter.

No pleural or pericardial effusion identified.

No enlarged or abnormal appearing axillary lymph nodes are
identified.

Scattered and patchy ground-glass opacities throughout right lung
identified-question aspiration or pneumonia no discrete pulmonary is
identified.

CT ABDOMEN AND PELVIS FINDINGS

Abdominal aortic, common iliac and common femoral artery aneurysms
are identified.

The abdominal aortic aneurysm measures 7.5 x 9 cm in maximal
diameter. Per prior report from 4448, the abdominal aortic aneurysm
measured 3.5 cm in maximum diameter.

The right common iliac artery measures 6 cm in greatest diameter.

The left common iliac artery measures 4.7 cm in greatest diameter.

The right common femoral artery measures 2.8 cm in greatest
diameter.

The left common femoral artery measures 4.1 cm in greatest diameter.

There is no evidence of free fluid or perianeurysmal
collection/hematoma.

The liver, spleen, right kidney adrenal glands and pancreas are
unremarkable.

Gallbladder sludge versus stones identified without CT evidence of
acute cholecystitis.

10 x 17 mm calculus within the left renal pelvis is identified
without hydronephrosis. At least 3 small nonobstructing calculi are
present within the mid-lower left kidney.

A probable cyst within the left lower pole is present.

Please note that parenchymal abnormalities may be missed without
intravenous contrast.

There is no evidence of enlarged lymph nodes, fluid or biliary
dilatation.

A very large of stool within the rectum is identified with mild
rectal wall thickening/edema. There is no evidence of bowel
obstruction, pneumoperitoneum or abscess.

A Foley catheter within the bladder is noted.

No acute or suspicious bony abnormalities identified.
IMPRESSION: 7.5 x 9 cm abdominal aortic aneurysm without evidence of rupture -
increased in size from 3.5 cm in 4448. Vascular surgery consultation
recommended due to increased risk of rupture for AAA >5.5 cm. This
recommendation follows ACR consensus. This recommendation follows
ACR consensus guidelines: White Paper of the ACR Incidental Findings
Committee II on Vascular Findings. [HOSPITAL] 5197;
[DATE].

Bilateral common iliac and common femoral artery aneurysms without
evidence of rupture.

Atelectasis/collapse of the majority of the left long. Mucous/soft
tissue fills the left mainstem bronchus.

Patchy ground-glass opacities throughout the right lung - likely
pneumonia or aspiration.

10 x 17 mm calculus within left renal pelvis - no evidence of
hydronephrosis. Other small nonobstructing left renal calculi.

Gallbladder sludge/stones - no CT evidence of acute cholecystitis.

Very large amount of rectal stool/impaction without obstruction.
Mild rectal wall thickening/edema.

Cardiomegaly and coronary artery disease.

Results were called by telephone at the time of interpretation on
07/16/2013 at [DATE] to Dr. TJANDA GETRUDE , who verbally
acknowledged these results.

## 2016-02-10 IMAGING — CR DG CHEST 1V
1 series · 1 of 1 positions shown · non-contrast
Comparison: DG CHEST 1V PORT dated 02/09/2005

CLINICAL DATA: Dyspnea

EXAM:
CHEST - 1 VIEW

[ap]
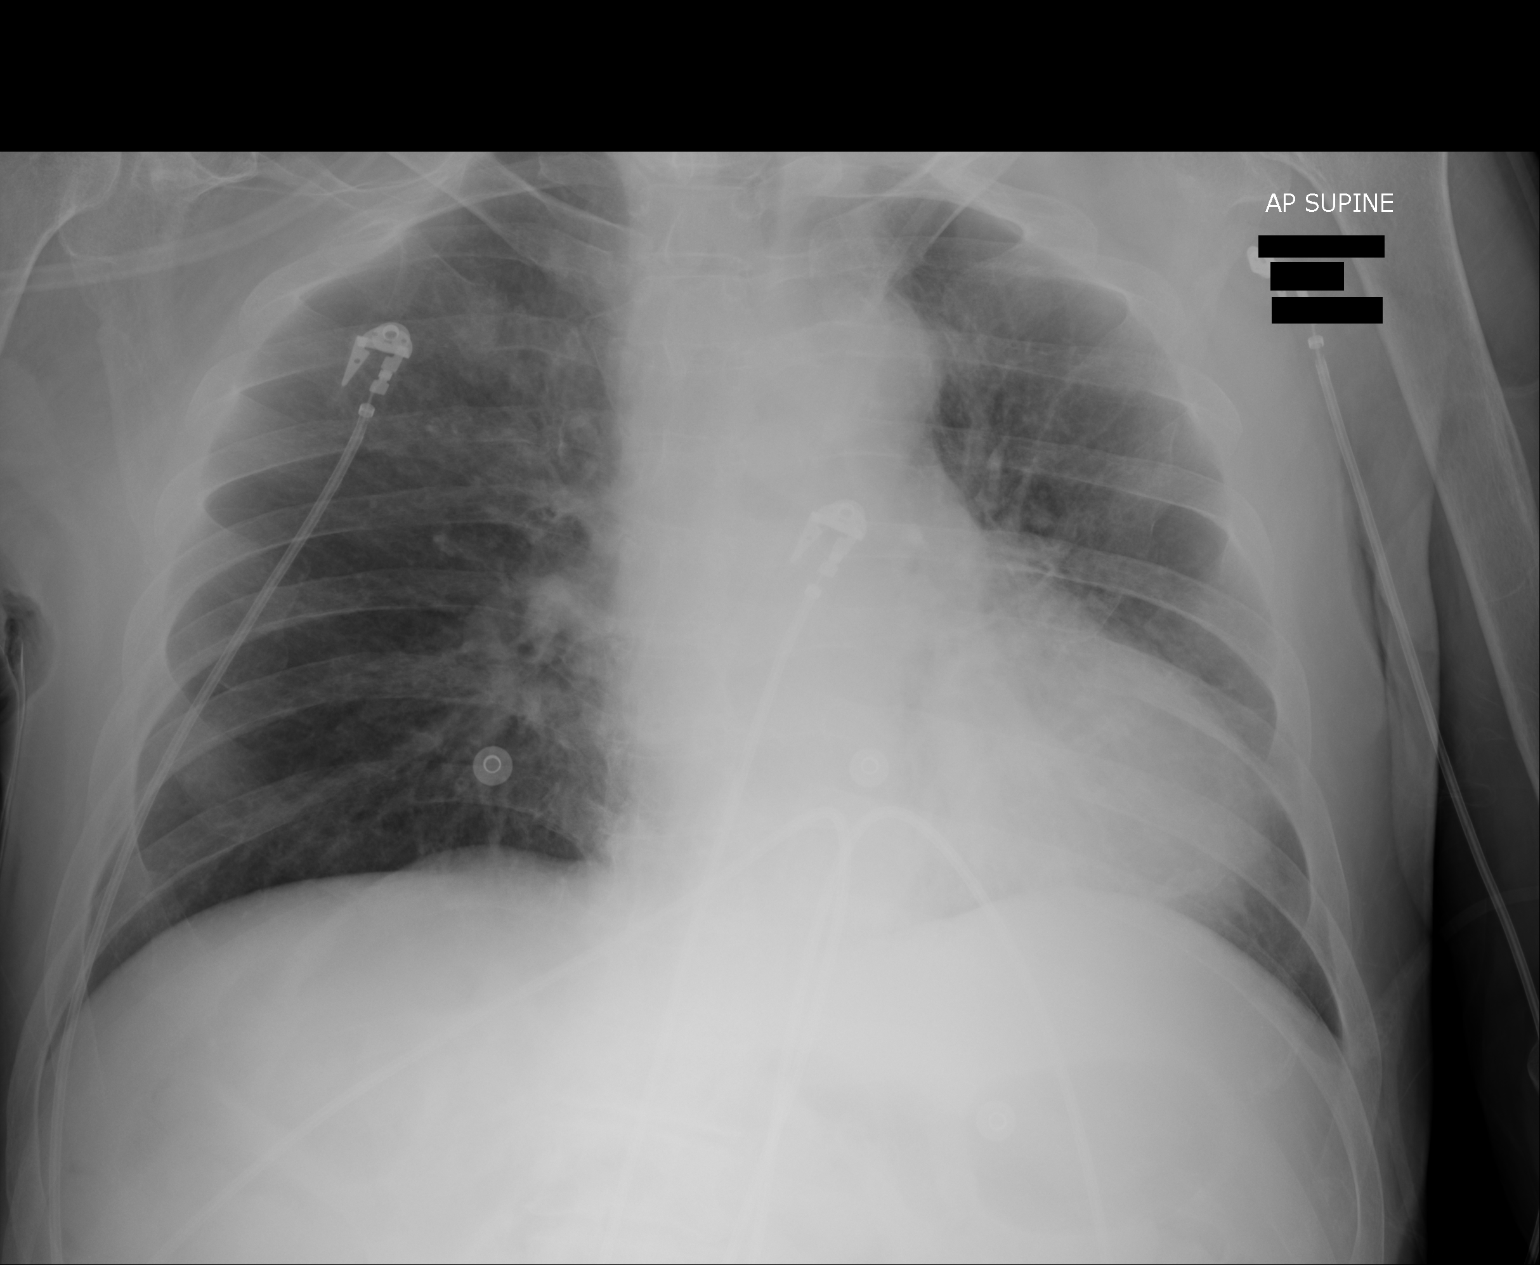

[1 of 1 positions shown; findings below may reference images not displayed]

FINDINGS: The lungs are adequately inflated. Increased interstitial markings
are demonstrated in the right upper lobe and in the left perihilar
region. There is no pleural effusion or pneumothorax or alveolar
infiltrate. The cardiac silhouette is top-normal in size. The
pulmonary vascularity is mildly prominent centrally.
IMPRESSION: Increased interstitial markings within both lungs likely reflects
interstitial pneumonia though asymmetric pulmonary edema could be
present. When the patient can tolerate the procedure, a PA and
lateral chest x-ray would be of value.
